# Patient Record
Sex: Female | Born: 1985 | Race: White | Hispanic: No | Marital: Single
Health system: Southern US, Community
[De-identification: ages and names within clinical notes are randomized; demographics above are authoritative.]

---
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
## 2004-07-11 ENCOUNTER — Other Ambulatory Visit: Admission: RE | Admit: 2004-07-11 | Discharge: 2004-07-11 | Payer: Self-pay | Admitting: Pediatrics

## 2004-07-11 ENCOUNTER — Other Ambulatory Visit: Admission: RE | Admit: 2004-07-11 | Discharge: 2004-07-11 | Payer: Self-pay | Admitting: Family Medicine

## 2004-09-25 ENCOUNTER — Encounter: Admission: RE | Admit: 2004-09-25 | Discharge: 2004-09-25 | Payer: Self-pay | Admitting: Family Medicine

## 2005-01-29 ENCOUNTER — Other Ambulatory Visit: Admission: RE | Admit: 2005-01-29 | Discharge: 2005-01-29 | Payer: Self-pay | Admitting: *Deleted

## 2008-04-22 ENCOUNTER — Emergency Department (HOSPITAL_COMMUNITY): Admission: EM | Admit: 2008-04-22 | Discharge: 2008-04-22 | Payer: Self-pay | Admitting: Emergency Medicine

## 2010-02-26 ENCOUNTER — Other Ambulatory Visit: Admission: RE | Admit: 2010-02-26 | Discharge: 2010-02-26 | Payer: Self-pay | Admitting: Obstetrics and Gynecology
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
## 2010-11-08 NOTE — Op Note (Signed)
Jody Matthews, FORNI NO.:  [id number]   MEDICAL RECORD NO.:  [id number]                   PATIENT TYPE:  INP   LOCATION:  2894                                 FACILITY:  [facility]   PHYSICIAN:  Kathaleen Maser. Pool, M.D.                 DATE OF BIRTH:  [birth year]   DATE OF PROCEDURE:  03/06/2003  DATE OF DISCHARGE:                                 OPERATIVE REPORT   PREOPERATIVE DIAGNOSIS:  Chiari type I malformation.   POSTOPERATIVE DIAGNOSIS:  Chiari type 1 malformation.   PROCEDURE:  Suboccipital craniectomy with C1 laminectomy and dural patch  grafting, Chiari decompression.  Microdissection.   SURGEON:  Kathaleen Maser. Pool, M.D.   ASSISTANT:  Donalee Citrin, M.D.   ANESTHESIA:  General endotracheal.   INDICATIONS:  [name] is an 25 year old female with a history of  worsening headaches and neck pain.  Workup has demonstrated evidence of a  very significant Chiari type 1 malformation.  The patient has been counseled  as to her options.  She has decided to proceed with suboccipital craniectomy  and C1 laminectomy with dural patch grafting for hopeful improvement in her  symptoms.  She is aware of the risks and benefits and wishes to proceed.   OPERATIVE NOTE:  The patient was taken to the operating room and placed on  the operating table in a supine position.  After an adequate level of  anesthesia achieved, the patient was positioned prone onto bolsters with her  head fixed in a somewhat flexed head position in a [place]field pin head rest.  The patient's posterior scalp and neck were prepped and draped sterilely.  A  10 blade was used to make a linear skin incision extending from the occiput  down to the level of the C2 spinous process.  This was carried down sharply  in the midline.  A subperiosteal dissection was then performed exposing the  suboccipital bone as well as the C1 and C2 lamina.  A deep self-retaining  retractor was placed.  C1 laminectomy and  suboccipital craniectomy were then  performed using the high-speed drill and the Kerrison rongeurs.  The  ligamentous ring at the level of the foramen magnum was divided.  The dura  was then elevated and incised in the midline.  The durotomy was then carried  cephalad over the cerebellar hemisphere and caudally to the level of the top  of the C2 lamina.  The dural edges were retracted and held in place with  traction sutures.  CSF was allowed to drain.  The cerebellar tonsils were  confirmed to be low-lying.  There were no structural abnormalities present  beyond that.  Microdissection was used to dissect around the tonsils and  confirm there were no further mass lesions.  The obex was identified and  found to be normal in appearance.  The wound was then irrigated.  A piece of  bovine pericardium  was then trimmed and then sutured into place in an  interrupted fashion using 4-0 Nurolon.  This expanded the craniocervical  dura.  Duragen dural substitute as well as Tisseel fibrin glue sealant was  then placed over the dural repair.  This achieved a watertight closure.  Gelfoam was placed  over the top of this.  The wound was then closed in layers with Vicryl  sutures.  Steri-Strips and a sterile dressing were applied.  There were no  apparent complications.  The patient tolerated the procedure well, and she  returns to the recovery room postop.                                               Henry A. Pool, M.D.    HAP/MEDQ  D:  03/06/2003  T:  03/06/2003  Job:  [id number]

## 2010-11-08 NOTE — Discharge Summary (Signed)
   NAMEEMALY, BOSCHERT                      ACCOUNT NO.:  [id number]   MEDICAL RECORD NO.:  [id number]                   PATIENT TYPE:  INP   LOCATION:  3005                                 FACILITY:  [facility]   PHYSICIAN:  Kathaleen Maser. Pool, M.D.                 DATE OF BIRTH:  Apr 07, 1986   DATE OF ADMISSION:  03/06/2003  DATE OF DISCHARGE:  03/09/2003                                 DISCHARGE SUMMARY   FINAL DIAGNOSIS:  Chiari I malformation.   OPERATIONS AND TREATMENTS:  Craniocervical decompression for Chiari  malformation.   HISTORY OF PRESENT ILLNESS:  Ms. Jody Matthews is an 25 year old female with  symptomatic Chiari type I malformation.  She presents now for decompression.   HOSPITAL COURSE:  The patient was taken to the operating room where an  uncomplicated Chiari decompression was performed.  Postoperatively, the  patient awakened neurologically intact.  Headaches were improved.  The wound  was healing well.  Her activity level gradually mobilized.  She had no  difficulty with fevers or meningismus.  She was rapidly mobilized and  discharged home on her third postoperative day.   CONDITION AT DISCHARGE:  Improved.   DISCHARGE DISPOSITION:  The patient will follow up in one week in my office.                                                Henry A. Pool, M.D.    HAP/MEDQ  D:  03/29/2003  T:  03/29/2003  Job:  [id number]

## 2010-11-08 NOTE — Consult Note (Signed)
[facility]  Patient:    Jody Matthews, Jody Matthews                   MRN: [id number] Proc. Date: 10/29/99 Adm. Date:  [id number] Disc. Date: [id number] Attending:  Ilene Qua CC:         Redmond Baseman, M.D.                          Consultation Report  CHIEF COMPLAINT:  Upper respiratory tract symptoms and possible abdominal pain.  HISTORY OF PRESENT ILLNESS:  Ms. Jody Matthews is a pleasant 25 year old female who developed a cough and upper respiratory tract symptoms on Sunday with a productive cough and green mucus. She reports she started having some vague abdominal pain yesterday and it has persisted so her family brought her to [facility] for Dr. Maurice March evaluation. The patient was then sent to me for further evaluation to rule out appendicitis. The patient reports that her basic complaint is upper respiratory tract infection and cough. She reports she is very hungry. She has had no problems moving her bowels. She has had no fever or chills. She has no dysuria. She reports that mostly she has vague abdominal pain but this has continued to lessen.  PAST MEDICAL HISTORY:  Remarkable only for asthma and history of upper respiratory tract infection.  PAST SURGICAL HISTORY:  She has no past surgical history.  ALLERGIES:  She had no known drug allergies.  SOCIAL HISTORY:  She lives with her parents. She does not smoke.  PHYSICAL EXAMINATION:  GENERAL:  Well-developed, well-nourished, 25 year old girl in no acute distress.  VITAL SIGNS:  She is afebrile and her vital signs are stable.  HEENT:  She is anicteric. Oropharynx is clear.  NECK:  Supple.  LUNGS:  Clear.  ABDOMEN:  Soft. There is a minimal right lower quadrant abdominal tenderness. There is no rebound, there is no guarding, there are no masses.  LABORATORY DATA:  The patient comes with labs from [facility] which show her to have a white blood count of 12,000 and a  normal urinalysis.  IMPRESSION:  A patient with an upper respiratory tract infection and coughing with mild abdominal discomfort of uncertain etiology. Most likely this represents the syndrome from her upper respiratory tract infection. I very seriously doubt appendicitis. At this point, we will give her a Z-pack for upper respiratory tract infection and her parents are instructed to bring her back to see me if her abdominal pain worsens. DD:  10/29/99 TD:  10/29/99 Job: [id number]

## 2011-03-07 ENCOUNTER — Other Ambulatory Visit: Payer: Self-pay | Admitting: Obstetrics and Gynecology

## 2011-03-07 ENCOUNTER — Other Ambulatory Visit (HOSPITAL_COMMUNITY)
Admission: RE | Admit: 2011-03-07 | Discharge: 2011-03-07 | Disposition: A | Discharge status: Home / Self Care | Payer: Managed Care, Other (non HMO) | Source: Ambulatory Visit | Attending: Interventional Cardiology | Admitting: Interventional Cardiology

## 2011-03-07 DIAGNOSIS — Z01419 Encounter for gynecological examination (general) (routine) without abnormal findings: Secondary | ICD-10-CM | POA: Insufficient documentation

## 2012-03-08 ENCOUNTER — Other Ambulatory Visit: Payer: Self-pay | Admitting: Obstetrics and Gynecology

## 2012-03-08 ENCOUNTER — Other Ambulatory Visit (HOSPITAL_COMMUNITY)
Admission: RE | Admit: 2012-03-08 | Discharge: 2012-03-08 | Disposition: A | Discharge status: Home / Self Care | Payer: 59 | Source: Ambulatory Visit | Attending: Interventional Cardiology | Admitting: Interventional Cardiology

## 2012-03-08 DIAGNOSIS — Z01419 Encounter for gynecological examination (general) (routine) without abnormal findings: Secondary | ICD-10-CM | POA: Insufficient documentation

## 2015-03-29 ENCOUNTER — Other Ambulatory Visit (HOSPITAL_COMMUNITY)
Admission: RE | Admit: 2015-03-29 | Discharge: 2015-03-29 | Disposition: A | Discharge status: Home / Self Care | Payer: BLUE CROSS/BLUE SHIELD | Source: Ambulatory Visit | Attending: Nurse Practitioner | Admitting: Nurse Practitioner

## 2015-03-29 ENCOUNTER — Other Ambulatory Visit: Payer: Self-pay | Admitting: Nurse Practitioner

## 2015-03-29 DIAGNOSIS — Z01419 Encounter for gynecological examination (general) (routine) without abnormal findings: Secondary | ICD-10-CM | POA: Diagnosis present

## 2015-03-29 DIAGNOSIS — Z1151 Encounter for screening for human papillomavirus (HPV): Secondary | ICD-10-CM | POA: Diagnosis present

## 2015-03-30 LAB — CYTOLOGY - PAP

## 2016-02-15 ENCOUNTER — Other Ambulatory Visit: Payer: Self-pay | Admitting: Neurology

## 2016-02-15 DIAGNOSIS — R51 Headache: Principal | ICD-10-CM

## 2016-02-15 DIAGNOSIS — R519 Headache, unspecified: Secondary | ICD-10-CM

## 2016-02-27 ENCOUNTER — Ambulatory Visit
Admission: RE | Admit: 2016-02-27 | Discharge: 2016-02-27 | Disposition: A | Discharge status: Home / Self Care | Payer: 59 | Source: Ambulatory Visit | Attending: Neurology | Admitting: Neurology

## 2016-02-27 DIAGNOSIS — R51 Headache: Principal | ICD-10-CM

## 2016-02-27 DIAGNOSIS — R519 Headache, unspecified: Secondary | ICD-10-CM

## 2016-04-01 ENCOUNTER — Other Ambulatory Visit: Payer: Self-pay | Admitting: Neurology

## 2016-04-01 DIAGNOSIS — E236 Other disorders of pituitary gland: Secondary | ICD-10-CM

## 2016-04-14 ENCOUNTER — Ambulatory Visit
Admission: RE | Admit: 2016-04-14 | Discharge: 2016-04-14 | Disposition: A | Discharge status: Home / Self Care | Payer: 59 | Source: Ambulatory Visit | Attending: Neurology | Admitting: Neurology

## 2016-04-14 DIAGNOSIS — E236 Other disorders of pituitary gland: Secondary | ICD-10-CM

## 2016-04-14 MED ORDER — GADOBENATE DIMEGLUMINE 529 MG/ML IV SOLN
10.0000 mL | Freq: Once | INTRAVENOUS | Status: AC | PRN
  Administered 2016-04-14: 10 mL via INTRAVENOUS

## 2016-06-12 ENCOUNTER — Other Ambulatory Visit: Payer: Self-pay | Admitting: Neurology

## 2016-06-12 DIAGNOSIS — G4489 Other headache syndrome: Secondary | ICD-10-CM

## 2016-06-25 ENCOUNTER — Other Ambulatory Visit: Payer: 59

## 2016-06-26 ENCOUNTER — Encounter: Payer: Self-pay | Admitting: Radiology

## 2016-06-26 ENCOUNTER — Other Ambulatory Visit: Payer: Self-pay | Admitting: Neurology

## 2016-06-26 ENCOUNTER — Ambulatory Visit
Admission: RE | Admit: 2016-06-26 | Discharge: 2016-06-26 | Disposition: A | Discharge status: Home / Self Care | Payer: 59 | Source: Ambulatory Visit | Attending: Neurology | Admitting: Neurology

## 2016-06-26 VITALS — BP 121/75 | HR 91

## 2016-06-26 DIAGNOSIS — G4489 Other headache syndrome: Secondary | ICD-10-CM

## 2016-06-26 LAB — CSF CELL COUNT WITH DIFFERENTIAL
RBC Count, CSF: 3 cells/uL (ref 0–10)
WBC, CSF: 0 cells/uL (ref 0–5)

## 2016-06-26 LAB — PROTEIN, CSF: TOTAL PROTEIN, CSF: 50 mg/dL — AB (ref 15–45)

## 2016-06-26 LAB — GLUCOSE, CSF: GLUCOSE CSF: 53 mg/dL (ref 43–76)

## 2016-06-26 NOTE — Discharge Instructions (Signed)

## 2016-06-28 LAB — CRYPTOCOCCAL AG, LTX SCR RFLX TITER: Cryptococcal Ag Screen: NOT DETECTED

## 2016-06-29 LAB — CSF CULTURE: GRAM STAIN: NONE SEEN

## 2016-06-29 LAB — CSF CULTURE W GRAM STAIN
Gram Stain: NONE SEEN
Organism ID, Bacteria: NO GROWTH

## 2016-07-08 ENCOUNTER — Other Ambulatory Visit: Payer: Self-pay | Admitting: Orthopedic Surgery

## 2016-07-08 ENCOUNTER — Other Ambulatory Visit: Payer: Self-pay | Admitting: Neurology

## 2016-07-08 DIAGNOSIS — R519 Headache, unspecified: Secondary | ICD-10-CM

## 2016-07-08 DIAGNOSIS — R51 Headache: Principal | ICD-10-CM

## 2016-07-23 ENCOUNTER — Ambulatory Visit
Admission: RE | Admit: 2016-07-23 | Discharge: 2016-07-23 | Disposition: A | Discharge status: Home / Self Care | Payer: 59 | Source: Ambulatory Visit | Attending: Neurology | Admitting: Neurology

## 2016-07-23 ENCOUNTER — Other Ambulatory Visit: Payer: 59

## 2016-07-23 DIAGNOSIS — R51 Headache: Principal | ICD-10-CM

## 2016-07-23 DIAGNOSIS — R519 Headache, unspecified: Secondary | ICD-10-CM

## 2016-07-23 MED ORDER — GADOBENATE DIMEGLUMINE 529 MG/ML IV SOLN
8.0000 mL | Freq: Once | INTRAVENOUS | Status: AC | PRN
  Administered 2016-07-23: 8 mL via INTRAVENOUS

## 2016-07-26 ENCOUNTER — Other Ambulatory Visit: Payer: 59

## 2018-01-05 IMAGING — MR MR HEAD WO/W CM
15 of 19 series · 32 of 48 positions shown · IV contrast (multihance)
Comparison: 04/14/2016 MRI head.

CLINICAL DATA: 30 y/o F; history of Chiari malformation enlarged
pituitary gland.

EXAM:
MRI HEAD WITHOUT AND WITH CONTRAST
TECHNIQUE: Multiplanar, multiecho pulse sequences of the brain and surrounding
structures were obtained without and with intravenous contrast.
CONTRAST:  80 cc MultiHance.

[Series 2: T1 · sagittal · 5.0mm · 0.45mm/px · 1 of 19 slices shown]
[im 1/19]
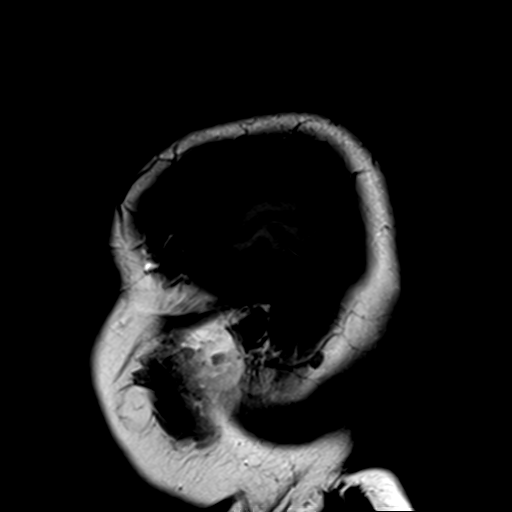

[Series 3: DWI · axial · 3.0mm · 1.80mm/px · z∈[-18,+129]mm · 8 of 100 slices shown]
[im 1/100]
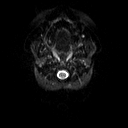
[im 12/100]
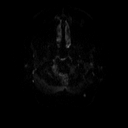
[im 34/100]
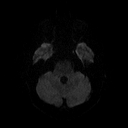
[im 45/100]
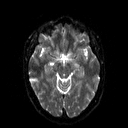
[im 56/100]
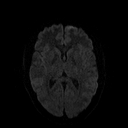
[im 67/100]
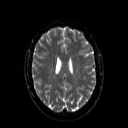
[im 89/100]
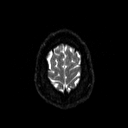
[im 100/100]
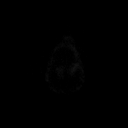

[Series 4: dwi_adc · axial · 3.0mm · 1.80mm/px · z∈[-18,+129]mm · 5 of 50 slices shown]
[im 1/50]
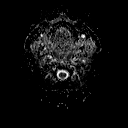
[im 13/50]
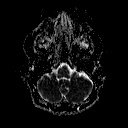
[im 25/50]
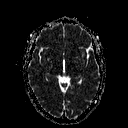
[im 37/50]
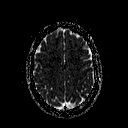
[im 50/50]
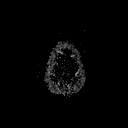

[Series 5: T2 · axial · 5.0mm · 0.36mm/px · z∈[-26,+130]mm · 3 of 25 slices shown]
[im 1/25]
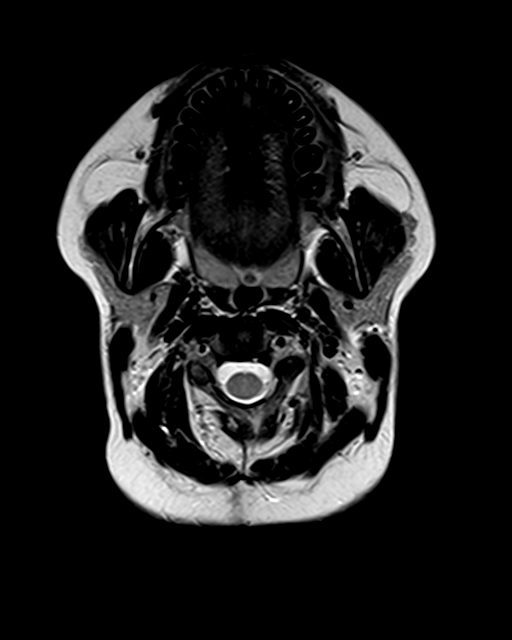
[im 13/25]
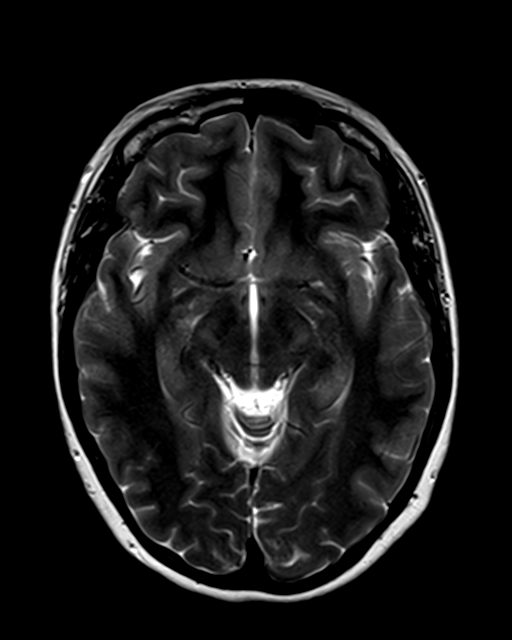
[im 25/25]
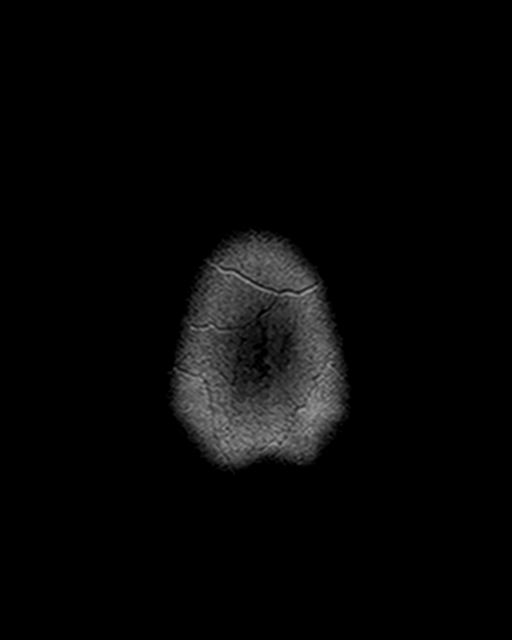

[Series 6: FLAIR · axial · 5.0mm · 0.45mm/px · z∈[-26,+130]mm · 3 of 25 slices shown]
[im 1/25]
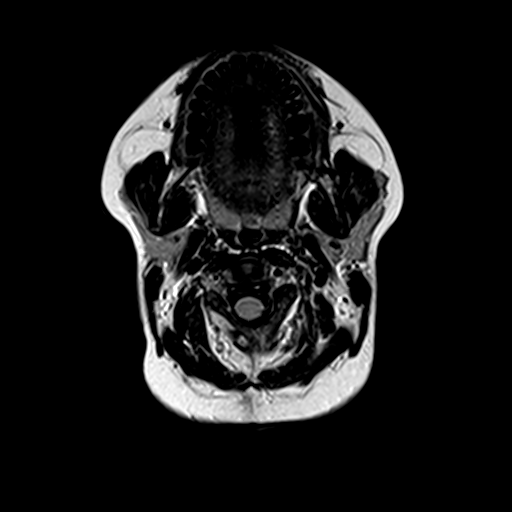
[im 13/25]
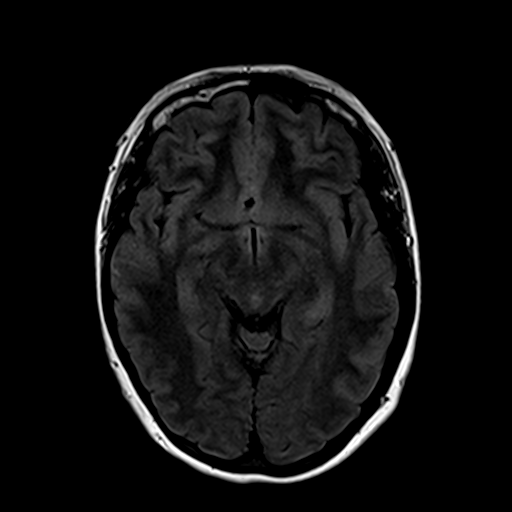
[im 25/25]
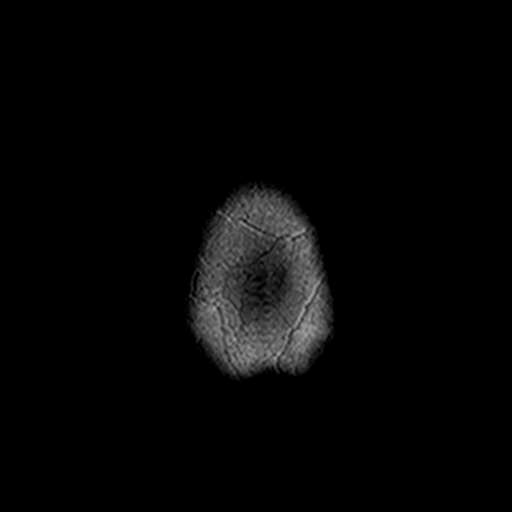

[Series 7: axial grad (blood) · axial · 5.0mm · 0.45mm/px · z∈[-29,+133]mm · 3 of 25 slices shown]
[im 1/25]
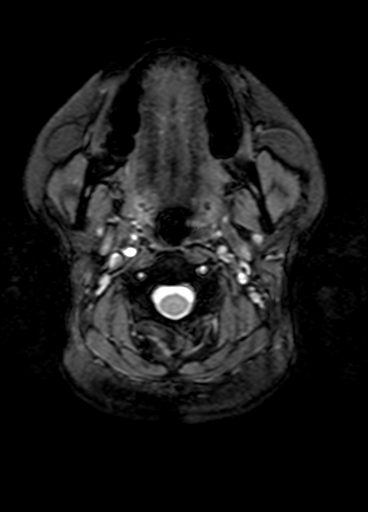
[im 13/25]
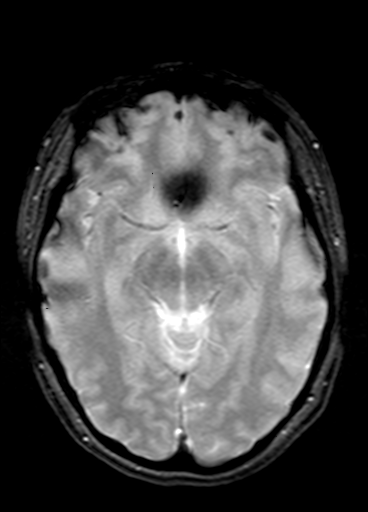
[im 25/25]
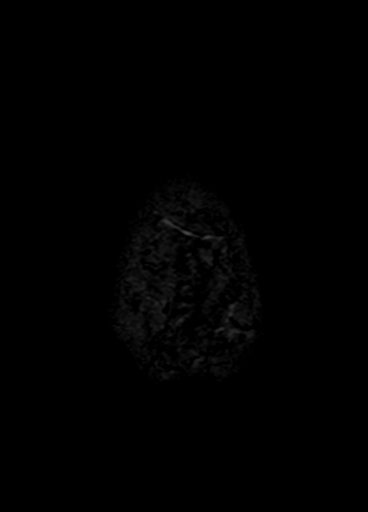

[Series 8: sag 3mm · sagittal · 3.0mm · 0.33mm/px · 1 of 11 slices shown]
[im 1/11]
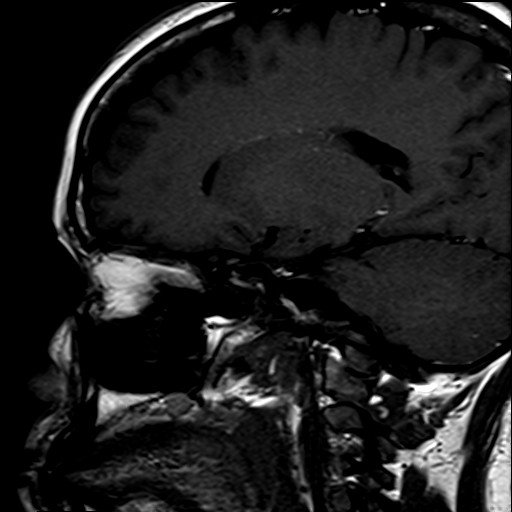

[Series 9: cor 3mm · coronal · 3.0mm · 0.33mm/px · 1 of 11 slices shown]
[im 1/11]
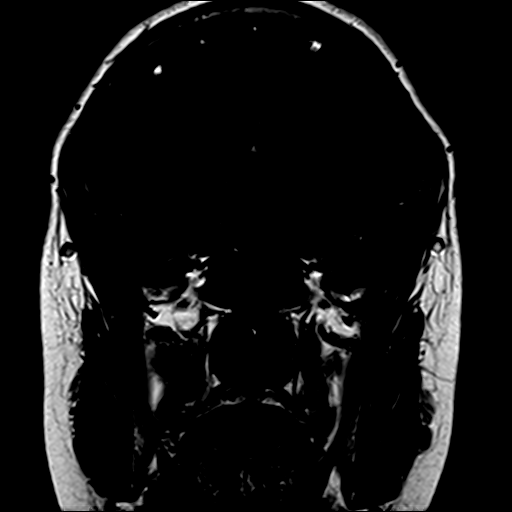

[Series 10: pre cor dynamic · coronal · non-contrast · 3.0mm · 0.35mm/px · 1 of 7 slices shown]
[im 1/7]
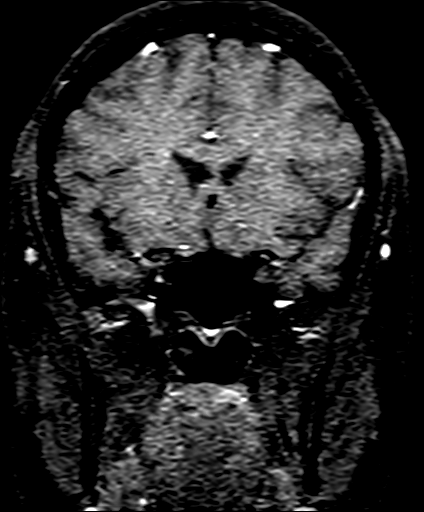

[Series 11: post fs cor · coronal · 3.0mm · 0.35mm/px · 1 of 7 slices shown (1 of 6)]
[im 1/7]
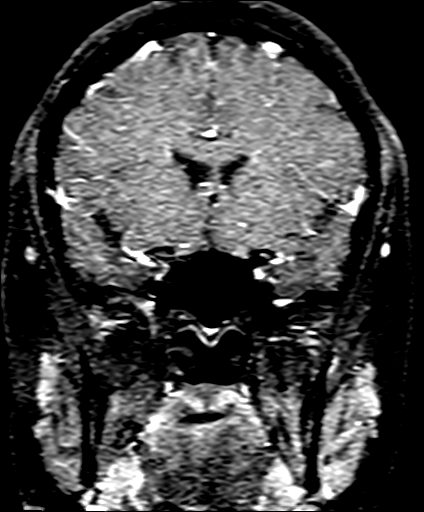

[Series 12: post fs cor · coronal · 3.0mm · 0.35mm/px · 1 of 7 slices shown (2 of 6)]
[im 1/7]
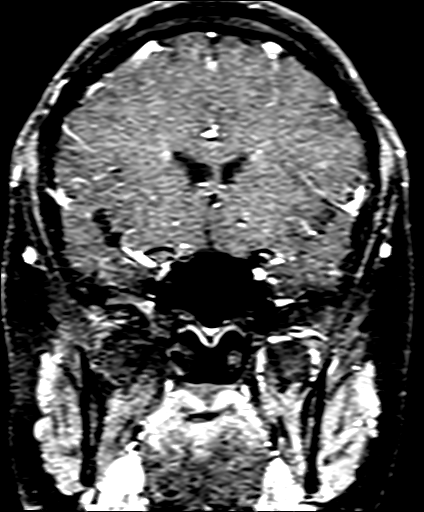

[Series 13: post fs cor · coronal · 3.0mm · 0.35mm/px · 1 of 7 slices shown (3 of 6)]
[im 1/7]
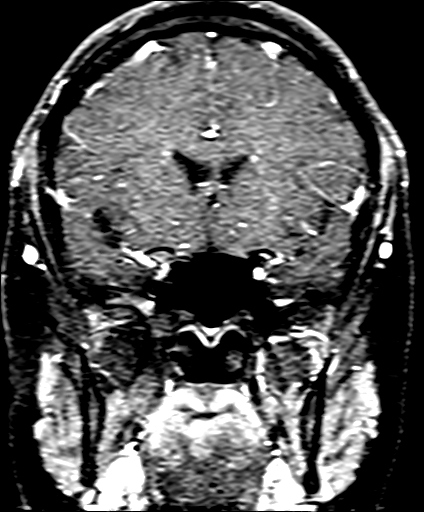

[Series 14: post fs cor · coronal · 3.0mm · 0.35mm/px · 1 of 7 slices shown (4 of 6)]
[im 1/7]
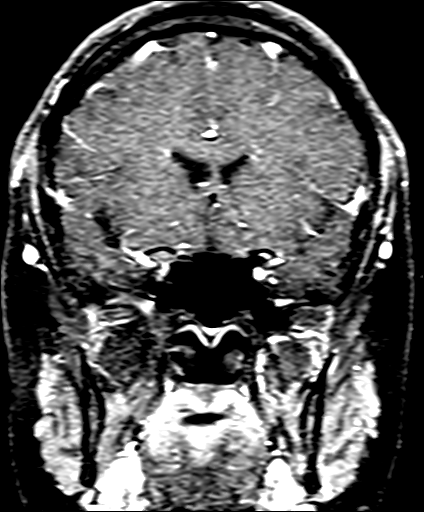

[Series 15: post fs cor · coronal · 3.0mm · 0.35mm/px · 1 of 7 slices shown (5 of 6)]
[im 1/7]
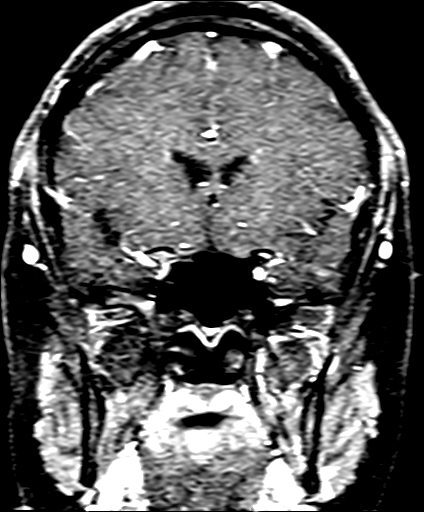

[Series 16: post fs cor · coronal · 3.0mm · 0.35mm/px · 1 of 7 slices shown (6 of 6)]
[im 1/7]
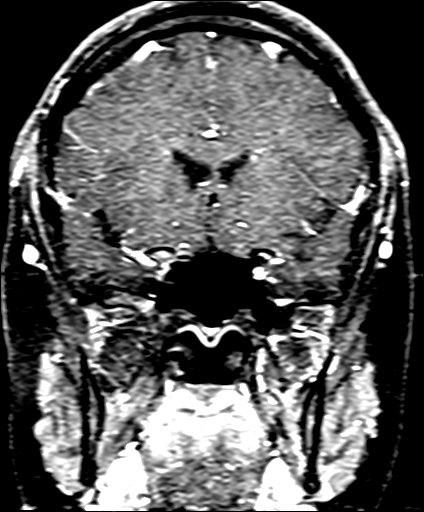

[32 of 48 positions shown; findings below may reference images not displayed]

FINDINGS: Brain: Stable low positioning of the cerebellar tonsils to the C2
level post suboccipital decompression. Stable crowding of the
foramen magnum and mass effect on medulla. No diffusion signal
abnormality. No significant T2 FLAIR signal abnormality. No
susceptibility hypointensity to indicate intracranial hemorrhage. No
focal mass effect. No extra-axial collection. No hydrocephalus. No
abnormal enhancement of the brain parenchyma.

Pituitary: The pituitary gland measures 8.8 mm craniocaudal,
previously 9.4 mm. Persistent early hypoenhancement right ureter on
dynamic imaging measuring 4 x 6 mm (series 12, image 4).

Vascular: Normal flow voids.

Skull and upper cervical spine: Normal marrow signal.

Sinuses/Orbits: Negative.

Other: None.
IMPRESSION: 1. Slight overall decrease in size of the pituitary gland with
persistent superior convex margin partially effacing the suprasellar
cistern. The size of the pituitary gland overall is within normal
limits for patient's age.
2. Persistent focus of hypoenhancement on dynamic imaging within the
right inferior pituitary measuring 4 x 6 mm suspicious for
microadenoma.
3. Chiari 1 malformation is stable post suboccipital decompression.
4. No acute process of the brain.

By: Pak Agus Hosan M.D.
